# Patient Record
Sex: Male | Born: 1990 | Race: Black or African American | Hispanic: No | Marital: Single | Smoking: Never smoker
Health system: Southern US, Community
[De-identification: ages and names within clinical notes are randomized; demographics above are authoritative.]

## PROBLEM LIST (undated history)

## (undated) DIAGNOSIS — K56609 Unspecified intestinal obstruction, unspecified as to partial versus complete obstruction: Secondary | ICD-10-CM

---

## 2015-01-26 ENCOUNTER — Emergency Department (HOSPITAL_BASED_OUTPATIENT_CLINIC_OR_DEPARTMENT_OTHER)
Admission: EM | Admit: 2015-01-26 | Discharge: 2015-01-26 | Disposition: A | Discharge status: Other Acute Inpatient Hospital | Payer: Federal, State, Local not specified - PPO | Attending: Emergency Medicine | Admitting: Emergency Medicine

## 2015-01-26 ENCOUNTER — Encounter (HOSPITAL_BASED_OUTPATIENT_CLINIC_OR_DEPARTMENT_OTHER): Payer: Self-pay

## 2015-01-26 ENCOUNTER — Emergency Department (HOSPITAL_BASED_OUTPATIENT_CLINIC_OR_DEPARTMENT_OTHER): Payer: Federal, State, Local not specified - PPO

## 2015-01-26 DIAGNOSIS — R7989 Other specified abnormal findings of blood chemistry: Secondary | ICD-10-CM | POA: Diagnosis not present

## 2015-01-26 DIAGNOSIS — R1031 Right lower quadrant pain: Secondary | ICD-10-CM | POA: Diagnosis present

## 2015-01-26 DIAGNOSIS — R945 Abnormal results of liver function studies: Secondary | ICD-10-CM

## 2015-01-26 DIAGNOSIS — K353 Acute appendicitis with localized peritonitis, without perforation or gangrene: Secondary | ICD-10-CM

## 2015-01-26 DIAGNOSIS — R109 Unspecified abdominal pain: Secondary | ICD-10-CM

## 2015-01-26 HISTORY — DX: Unspecified intestinal obstruction, unspecified as to partial versus complete obstruction: K56.609

## 2015-01-26 LAB — URINALYSIS, ROUTINE W REFLEX MICROSCOPIC
Bilirubin Urine: NEGATIVE
Glucose, UA: NEGATIVE mg/dL
HGB URINE DIPSTICK: NEGATIVE
KETONES UR: 15 mg/dL — AB
Leukocytes, UA: NEGATIVE
NITRITE: NEGATIVE
Protein, ur: NEGATIVE mg/dL
Urobilinogen, UA: 0.2 mg/dL (ref 0.0–1.0)
pH: 6.5 (ref 5.0–8.0)

## 2015-01-26 LAB — CBC WITH DIFFERENTIAL/PLATELET
BASOS ABS: 0 10*3/uL (ref 0.0–0.1)
BASOS PCT: 0 % (ref 0–1)
EOS ABS: 0 10*3/uL (ref 0.0–0.7)
EOS PCT: 0 % (ref 0–5)
HCT: 43 % (ref 39.0–52.0)
Hemoglobin: 15 g/dL (ref 13.0–17.0)
LYMPHS ABS: 0.6 10*3/uL — AB (ref 0.7–4.0)
LYMPHS PCT: 3 % — AB (ref 12–46)
MCH: 31.3 pg (ref 26.0–34.0)
MCHC: 34.9 g/dL (ref 30.0–36.0)
MCV: 89.8 fL (ref 78.0–100.0)
MONO ABS: 1.9 10*3/uL — AB (ref 0.1–1.0)
Monocytes Relative: 8 % (ref 3–12)
NEUTROS PCT: 89 % — AB (ref 43–77)
Neutro Abs: 19.9 10*3/uL — ABNORMAL HIGH (ref 1.7–7.7)
PLATELETS: 260 10*3/uL (ref 150–400)
RBC: 4.79 MIL/uL (ref 4.22–5.81)
RDW: 12 % (ref 11.5–15.5)
WBC: 22.4 10*3/uL — AB (ref 4.0–10.5)

## 2015-01-26 LAB — COMPREHENSIVE METABOLIC PANEL
ALT: 61 U/L — ABNORMAL HIGH (ref 0–53)
AST: 45 U/L — AB (ref 0–37)
Albumin: 4.8 g/dL (ref 3.5–5.2)
Alkaline Phosphatase: 102 U/L (ref 39–117)
Anion gap: 11 (ref 5–15)
BUN: 20 mg/dL (ref 6–23)
CALCIUM: 10 mg/dL (ref 8.4–10.5)
CHLORIDE: 100 mmol/L (ref 96–112)
CO2: 25 mmol/L (ref 19–32)
Creatinine, Ser: 1.08 mg/dL (ref 0.50–1.35)
GFR calc Af Amer: >90 mL/min (ref >90)
GLUCOSE: 131 mg/dL — AB (ref 70–99)
POTASSIUM: 4.8 mmol/L (ref 3.5–5.1)
Sodium: 136 mmol/L (ref 135–145)
Total Bilirubin: 0.9 mg/dL (ref 0.3–1.2)
Total Protein: 8.7 g/dL — ABNORMAL HIGH (ref 6.0–8.3)

## 2015-01-26 LAB — LIPASE, BLOOD: LIPASE: 22 U/L (ref 11–59)

## 2015-01-26 MED ORDER — HYDROMORPHONE HCL 1 MG/ML IJ SOLN
1.0000 mg | Freq: Once | INTRAMUSCULAR | Status: AC
  Administered 2015-01-26: 1 mg via INTRAVENOUS

## 2015-01-26 MED ORDER — ONDANSETRON HCL 4 MG/2ML IJ SOLN
4.0000 mg | Freq: Once | INTRAMUSCULAR | Status: AC
  Administered 2015-01-26: 4 mg via INTRAVENOUS
  Filled 2015-01-26: qty 2

## 2015-01-26 MED ORDER — IOHEXOL 300 MG/ML  SOLN
100.0000 mL | Freq: Once | INTRAMUSCULAR | Status: AC | PRN
  Administered 2015-01-26: 100 mL via INTRAVENOUS

## 2015-01-26 MED ORDER — IOHEXOL 300 MG/ML  SOLN
25.0000 mL | Freq: Once | INTRAMUSCULAR | Status: AC | PRN
  Administered 2015-01-26: 25 mL via ORAL

## 2015-01-26 MED ORDER — SODIUM CHLORIDE 0.9 % IV SOLN
1.0000 g | Freq: Once | INTRAVENOUS | Status: AC
  Administered 2015-01-26: 1 g via INTRAVENOUS
  Filled 2015-01-26: qty 1

## 2015-01-26 MED ORDER — MORPHINE SULFATE 4 MG/ML IJ SOLN
4.0000 mg | Freq: Once | INTRAMUSCULAR | Status: AC
  Administered 2015-01-26: 4 mg via INTRAVENOUS
  Filled 2015-01-26: qty 1

## 2015-01-26 MED ORDER — HYDROMORPHONE HCL 1 MG/ML IJ SOLN
INTRAMUSCULAR | Status: AC
  Filled 2015-01-26: qty 1

## 2015-01-26 MED ORDER — SODIUM CHLORIDE 0.9 % IV BOLUS (SEPSIS)
500.0000 mL | Freq: Once | INTRAVENOUS | Status: AC
  Administered 2015-01-26: 500 mL via INTRAVENOUS

## 2015-01-26 MED ORDER — HYDROMORPHONE HCL 1 MG/ML IJ SOLN: 0.5000 mg | Freq: Once | INTRAMUSCULAR | Status: DC

## 2015-01-26 NOTE — ED Notes (Signed)
Patient transported to CT 

## 2015-01-26 NOTE — ED Provider Notes (Signed)
CSN: [id number]     Arrival date & time 01/26/15  1422 History   First MD Initiated Contact with Patient 01/26/15 1440     Chief Complaint  Patient presents with  . Abdominal Pain     (Consider location/radiation/quality/duration/timing/severity/associated sxs/prior Treatment) HPI Comments: Pt states vomiting and abdominal pain started this morning. Pt states that last bm was 2 days ago. Tried laxative for symptoms without relief  Patient is a 24 y.o. male presenting with abdominal pain. No language interpreter was used.  Abdominal Pain Pain location:  RLQ and LLQ Pain quality: aching   Pain radiates to:  Does not radiate Pain severity:  Moderate Onset quality:  Sudden Timing:  Constant Progression:  Unchanged Chronicity:  New Context: not alcohol use, not medication withdrawal and not suspicious food intake   Relieved by:  Nothing Worsened by:  Nothing tried   Past Medical History  Diagnosis Date  . Bowel obstruction    History reviewed. No pertinent past surgical history. No family history on file. History  Substance Use Topics  . Smoking status: Never Smoker   . Smokeless tobacco: Not on file  . Alcohol Use: Yes    Review of Systems  Gastrointestinal: Positive for abdominal pain.  All other systems reviewed and are negative.     Allergies  Review of patient's allergies indicates no known allergies.  Home Medications   Prior to Admission medications   Not on File   BP 130/59 mmHg  Pulse 94  Temp(Src) 98 F (36.7 C) (Oral)  Resp 16  Ht  (1.778 m)  Wt 180 lb (81.647 kg)  BMI 25.83 kg/m2  SpO2 100% Physical Exam  Constitutional: He is oriented to person, place, and time. He appears well-developed and well-nourished.  Cardiovascular: Normal rate and regular rhythm.   Pulmonary/Chest: Effort normal and breath sounds normal.  Abdominal: Soft. Bowel sounds are normal.  Generalized lower abdominal tenderness  Musculoskeletal: Normal range of  motion.  Neurological: He is alert and oriented to person, place, and time. Coordination normal.  Skin: Skin is warm and dry.  Psychiatric: He has a normal mood and affect.  Nursing note and vitals reviewed.   ED Course  Procedures (including critical care time) Labs Review Labs Reviewed  COMPREHENSIVE METABOLIC PANEL - Abnormal; Notable for the following:    Glucose, Bld 131 (*)    Total Protein 8.7 (*)    AST 45 (*)    ALT 61 (*)    All other components within normal limits  CBC WITH DIFFERENTIAL/PLATELET - Abnormal; Notable for the following:    WBC 22.4 (*)    Neutrophils Relative % 89 (*)    Neutro Abs 19.9 (*)    Lymphocytes Relative 3 (*)    Lymphs Abs 0.6 (*)    Monocytes Absolute 1.9 (*)    All other components within normal limits  URINALYSIS, ROUTINE W REFLEX MICROSCOPIC - Abnormal; Notable for the following:    Specific Gravity, Urine >1.046 (*)    Ketones, ur 15 (*)    All other components within normal limits  LIPASE, BLOOD    Imaging Review Ct Abdomen Pelvis W Contrast  01/26/2015   CLINICAL DATA:  Pain in the abdomen below the umbilicus since this morning. Nausea and vomiting.  EXAM: CT ABDOMEN AND PELVIS WITH CONTRAST  TECHNIQUE: Multidetector CT imaging of the abdomen and pelvis was performed using the standard protocol following bolus administration of intravenous contrast.  CONTRAST:  25mL OMNIPAQUE IOHEXOL 300 MG/ML  SOLN, 100mL OMNIPAQUE IOHEXOL 300 MG/ML SOLN  COMPARISON:  None.  FINDINGS: The liver, spleen, pancreas, gallbladder, adrenal glands and kidneys are normal. There is no hydronephrosis bilaterally. The aorta is normal. There is no abdominal lymphadenopathy. There is no small bowel obstruction or diverticulitis. The appendix is dilated measuring 11 mm distally. There is wall thickening of the appendix and an appendiculith is present. There is no significant stranding surrounding the appendix.  Fluid-filled bladder is normal. There is no pelvic  lymphadenopathy. The visualized lung bases are clear. No acute abnormalities identified within the visualized bones.  IMPRESSION: Findings consistent with early appendicitis.   Electronically Signed   By: Sherian ReinWei-Chen  Lin M.D.   On: 01/26/2015 16:19     EKG Interpretation None      MDM   Final diagnoses:  Abdominal pain  Acute appendicitis with localized peritonitis  Elevated LFTs    Pt to go to [facility] for [facility]icit[facility]. Spoke with DR. Buzzy Hanteppara with general surgery. Pt given a dose of invanz here    Teressa LowerVrinda Walaa Carel, NP 01/26/15 1653  Blane OharaJoshua Zavitz, MD 01/28/15 [phone]

## 2015-01-26 NOTE — ED Notes (Signed)
RLQ pain since 5am-vomited x 3-4

## 2015-01-26 NOTE — ED Notes (Signed)
[facility] but no one answered the phone.

## 2016-07-27 IMAGING — CT CT ABD-PELV W/ CM
2 of 4 series · 16 of 46 positions shown, 18 images · IV contrast (APPLIED)
Comparison: None.

CLINICAL DATA: Pain in the abdomen below the umbilicus since this
morning. Nausea and vomiting.

EXAM:
CT ABDOMEN AND PELVIS WITH CONTRAST
TECHNIQUE: Multidetector CT imaging of the abdomen and pelvis was performed
using the standard protocol following bolus administration of
intravenous contrast.
CONTRAST:  25mL OMNIPAQUE IOHEXOL 300 MG/ML SOLN, 100mL OMNIPAQUE
IOHEXOL 300 MG/ML SOLN

[Series 2: abd/pelvis 5.0 b31f · axial · 0.72mm/px · z∈[-506,-71]mm · 13 of 95 slices shown, 15 images]
[im 4/95  soft-tissue]
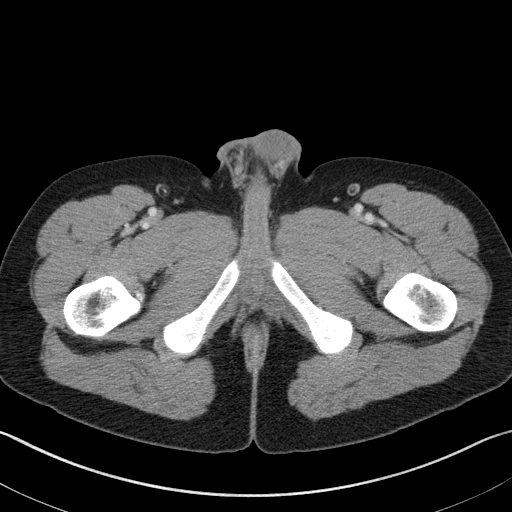
[im 4/95  bone]
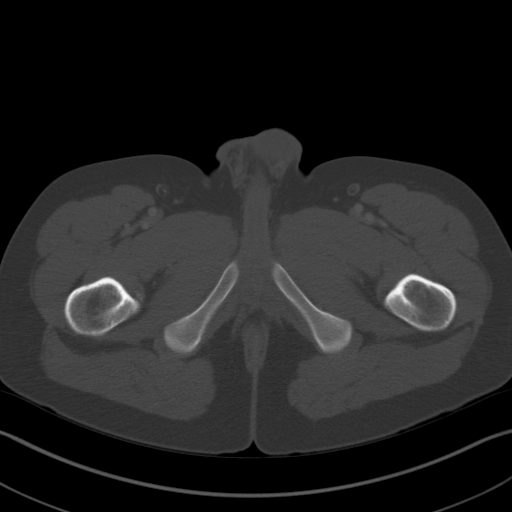
[im 12/95  soft-tissue]
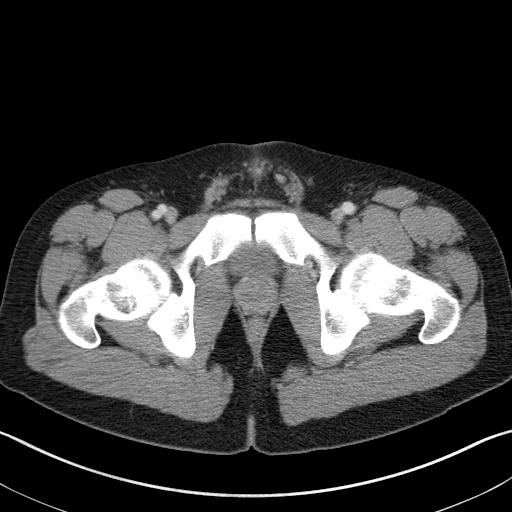
[im 20/95  soft-tissue]
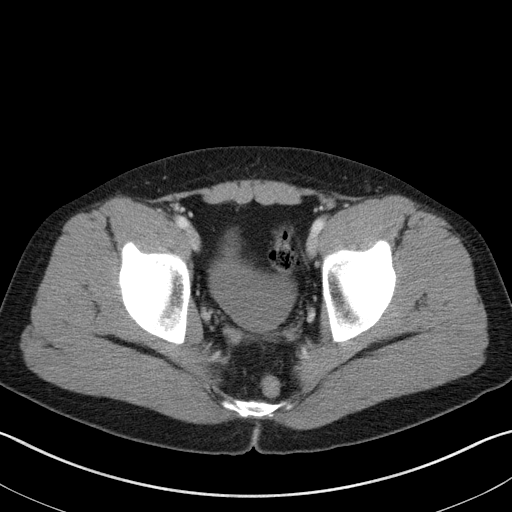
[im 28/95  soft-tissue]
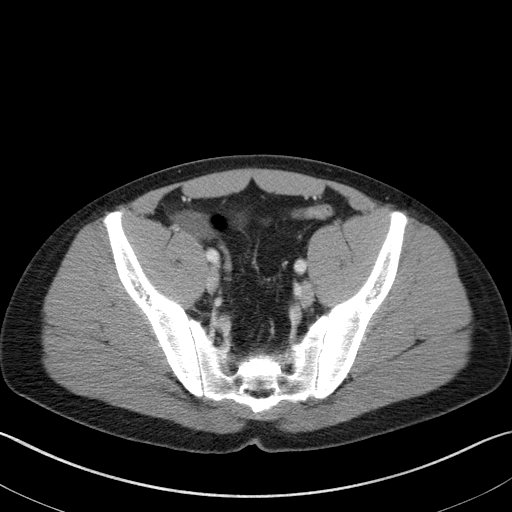
[im 32/95  soft-tissue]
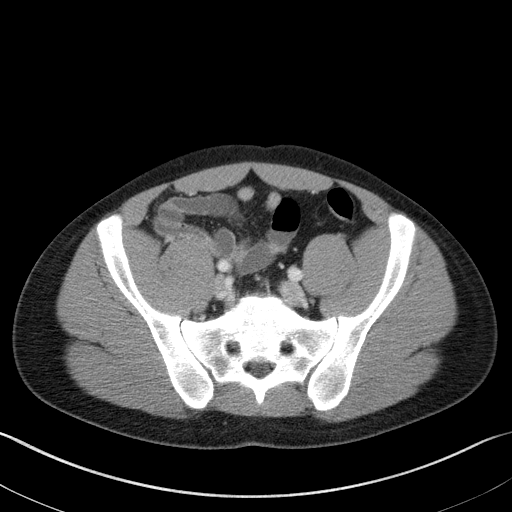
[im 40/95  soft-tissue]
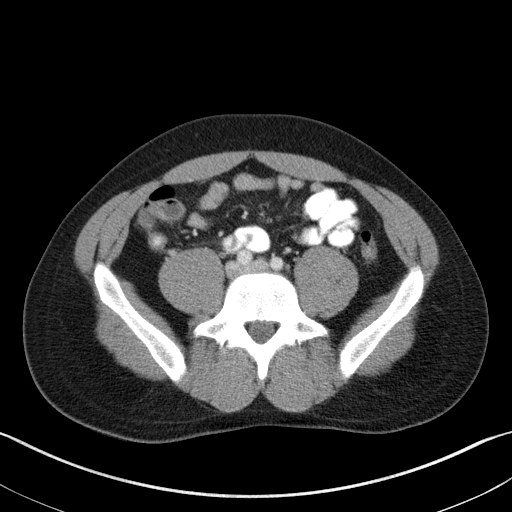
[im 48/95  soft-tissue]
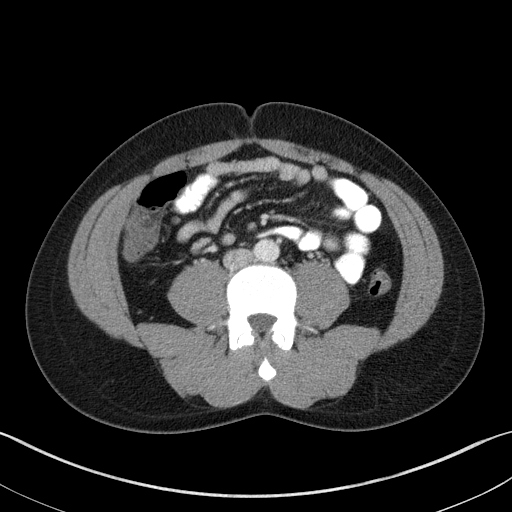
[im 55/95  soft-tissue]
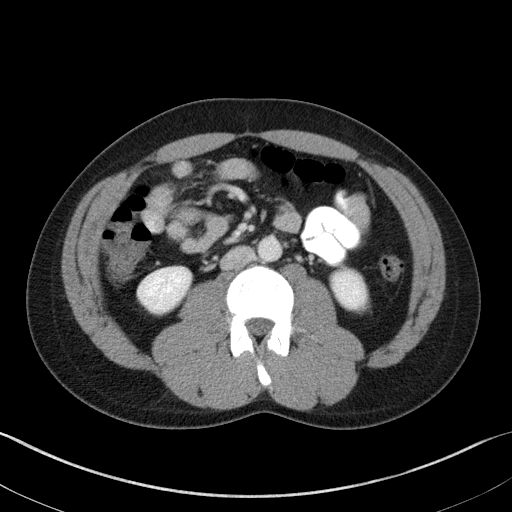
[im 63/95  soft-tissue]
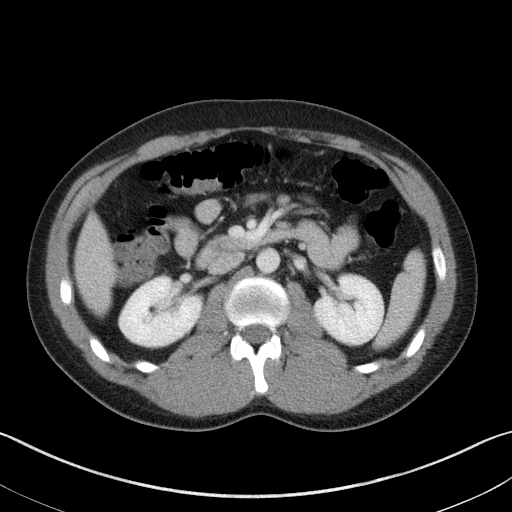
[im 63/95  bone]
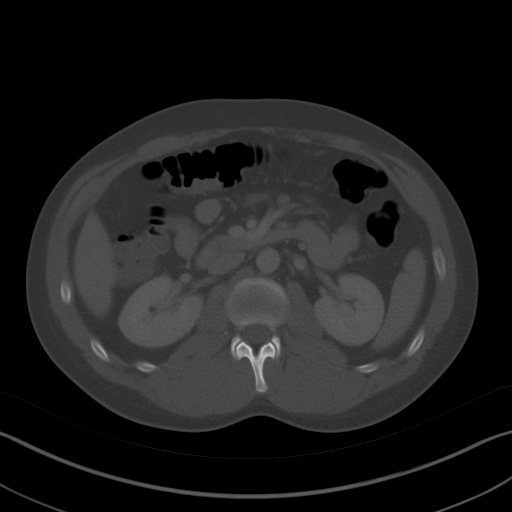
[im 67/95  soft-tissue]
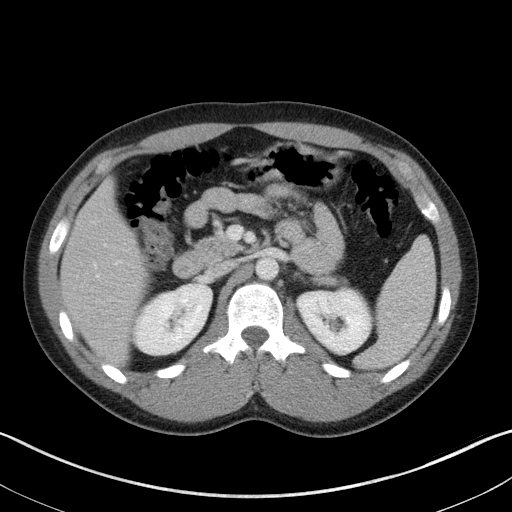
[im 75/95  soft-tissue]
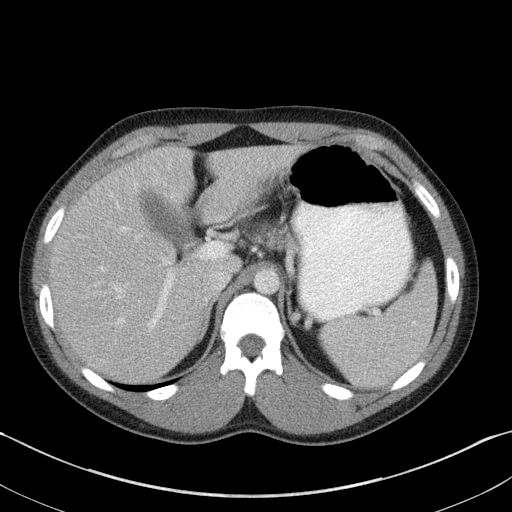
[im 83/95  soft-tissue]
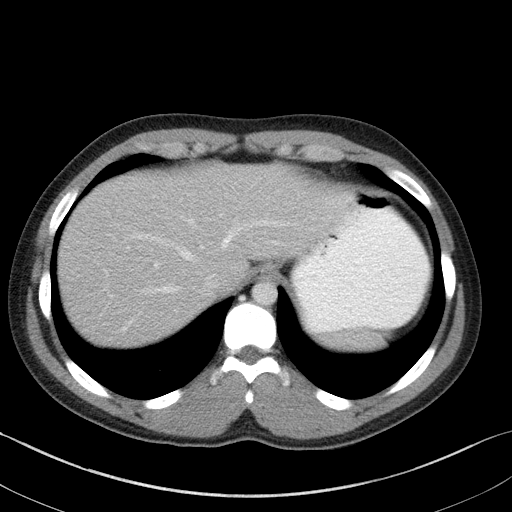
[im 91/95  soft-tissue]
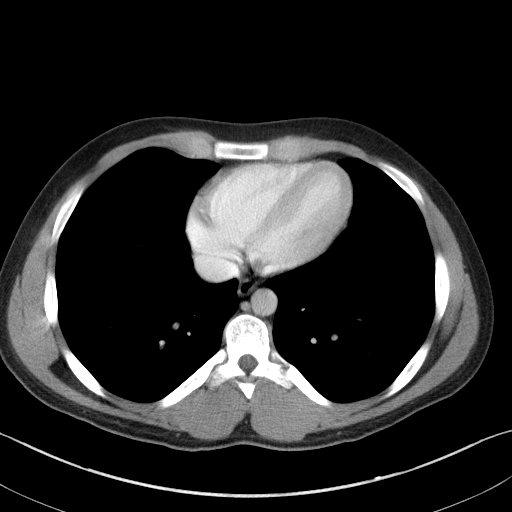

[Series 5: abd/pelvis 3.0 coronal · coronal · 0.75mm/px · 3 of 74 slices shown]
[im 25/74  soft-tissue]
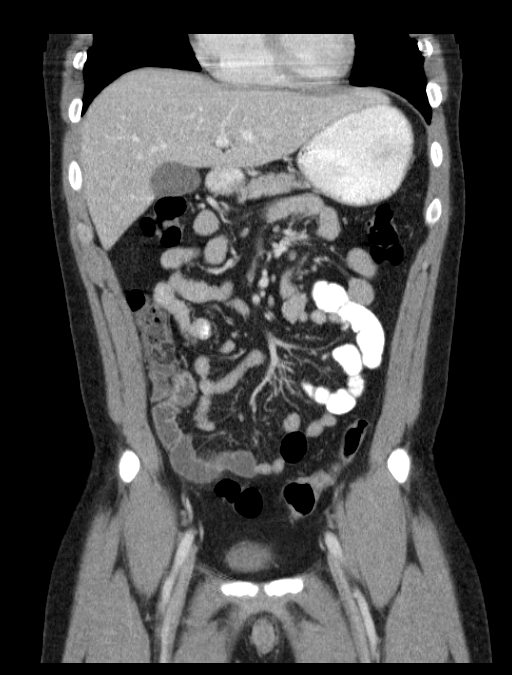
[im 33/74  soft-tissue]
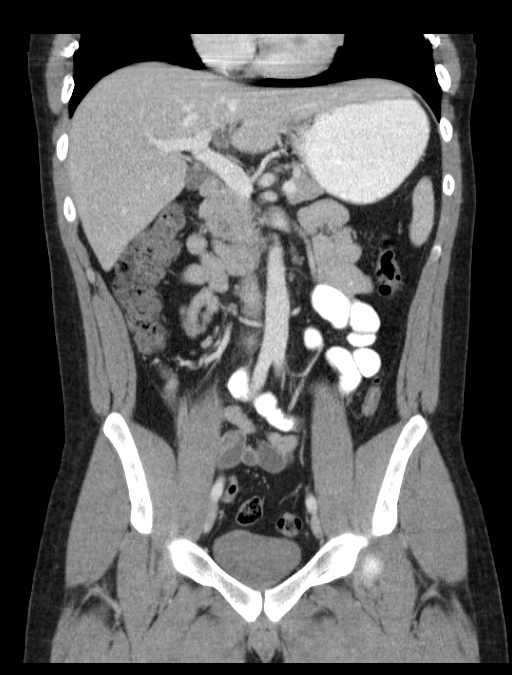
[im 41/74  soft-tissue]
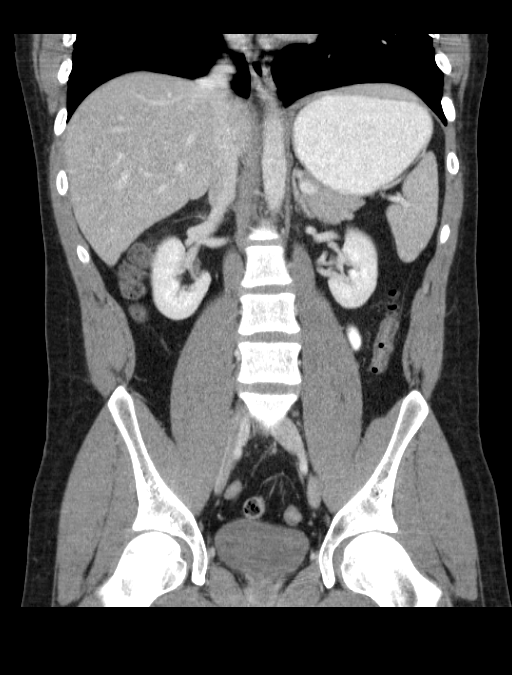

[16 of 46 positions shown; findings below may reference images not displayed]

FINDINGS: The liver, spleen, pancreas, gallbladder, adrenal glands and kidneys
are normal. There is no hydronephrosis bilaterally. The aorta is
normal. There is no abdominal lymphadenopathy. There is no small
bowel obstruction or diverticulitis. The appendix is dilated
measuring 11 mm distally. There is wall thickening of the appendix
and an appendiculith is present. There is no significant stranding
surrounding the appendix.

Fluid-filled bladder is normal. There is no pelvic lymphadenopathy.
The visualized lung bases are clear. No acute abnormalities
identified within the visualized bones.
IMPRESSION: Findings consistent with early appendicitis.
